# Patient Record
Sex: Female | Born: 1970 | Race: White | Hispanic: No | Marital: Married | Smoking: Former smoker
Health system: Southern US, Community
[De-identification: ages and names within clinical notes are randomized; demographics above are authoritative.]

## PROBLEM LIST (undated history)

## (undated) DIAGNOSIS — E7800 Pure hypercholesterolemia, unspecified: Secondary | ICD-10-CM

## (undated) DIAGNOSIS — I10 Essential (primary) hypertension: Secondary | ICD-10-CM

## (undated) DIAGNOSIS — I499 Cardiac arrhythmia, unspecified: Secondary | ICD-10-CM

## (undated) DIAGNOSIS — R011 Cardiac murmur, unspecified: Secondary | ICD-10-CM

## (undated) HISTORY — PX: TUBAL LIGATION: SHX77

## (undated) HISTORY — PX: WISDOM TOOTH EXTRACTION: SHX21

---

## 2020-08-30 ENCOUNTER — Ambulatory Visit: Payer: Self-pay | Admitting: General Surgery

## 2020-08-30 DIAGNOSIS — R928 Other abnormal and inconclusive findings on diagnostic imaging of breast: Secondary | ICD-10-CM

## 2020-08-31 ENCOUNTER — Other Ambulatory Visit: Payer: Self-pay | Admitting: General Surgery

## 2020-08-31 DIAGNOSIS — R928 Other abnormal and inconclusive findings on diagnostic imaging of breast: Secondary | ICD-10-CM

## 2020-10-08 NOTE — Pre-Procedure Instructions (Signed)
Surgical Instructions    Your procedure is scheduled on Thursday, March 24th.  Report to [facility] Main Entrance "A" at 5:30 A.M., then check in with the Admitting office.  Call this number if you have problems the morning of surgery:  [phone]   If you have any questions prior to your surgery date call [phone]: Open Monday-Friday 8am-4pm    Remember:  Do not eat after midnight the night before your surgery  You may drink clear liquids until 4:30 a.m. the morning of your surgery.   Clear liquids allowed are: Water, Non-Citrus Juices (without pulp), Carbonated Beverages, Clear Tea, Black Coffee Only, and Gatorade    There are no medications that you need to take on the morning of surgery.  As of today, STOP taking any Aspirin (unless otherwise instructed by your surgeon) Aleve, Naproxen, Ibuprofen, Motrin, Advil, Goody's, BC's, all herbal medications, fish oil, and all vitamins.                     Do not wear jewelry, make up, or nail polish            Do not wear lotions, powders, perfumes, or deodorant.            Do not shave 48 hours prior to surgery.              Do not bring valuables to the hospital.            [facility] is not responsible for any belongings or valuables.  Do NOT Smoke (Tobacco/Vaping) or drink Alcohol 24 hours prior to your procedure If you use a CPAP at night, you may bring all equipment for your overnight stay.   Contacts, glasses, dentures or bridgework may not be worn into surgery, please bring cases for these belongings   For patients admitted to the hospital, discharge time will be determined by your treatment team.   Patients discharged the day of surgery will not be allowed to drive home, and someone needs to stay with them for 24 hours.    Special instructions:   [place]- Preparing For Surgery  Before surgery, you can play an important role. Because skin is not sterile, your skin needs to be as free of germs as possible. You  can reduce the number of germs on your skin by washing with CHG (chlorahexidine gluconate) Soap before surgery.  CHG is an antiseptic cleaner which kills germs and bonds with the skin to continue killing germs even after washing.    Oral Hygiene is also important to reduce your risk of infection.  Remember - BRUSH YOUR TEETH THE MORNING OF SURGERY WITH YOUR REGULAR TOOTHPASTE  Please do not use if you have an allergy to CHG or antibacterial soaps. If your skin becomes reddened/irritated stop using the CHG.  Do not shave (including legs and underarms) for at least 48 hours prior to first CHG shower. It is OK to shave your face.  Please follow these instructions carefully.   1. Shower the NIGHT BEFORE SURGERY and the MORNING OF SURGERY  2. If you chose to wash your hair, wash your hair first as usual with your normal shampoo.  3. After you shampoo, rinse your hair and body thoroughly to remove the shampoo.  4. Use CHG Soap as you would any other liquid soap. You can apply CHG directly to the skin and wash gently with a scrungie or a clean washcloth.   5. Apply the CHG  Soap to your body ONLY FROM THE NECK DOWN.  Do not use on open wounds or open sores. Avoid contact with your eyes, ears, mouth and genitals (private parts). Wash Face and genitals (private parts)  with your normal soap.   6. Wash thoroughly, paying special attention to the area where your surgery will be performed.  7. Thoroughly rinse your body with warm water from the neck down.  8. DO NOT shower/wash with your normal soap after using and rinsing off the CHG Soap.  9. Pat yourself dry with a CLEAN TOWEL.  10. Wear CLEAN PAJAMAS to bed the night before surgery  11. Place CLEAN SHEETS on your bed the night before your surgery  12. DO NOT SLEEP WITH PETS.   Day of Surgery: Shower with CHG soap Wear Clean/Comfortable clothing the morning of surgery Do not apply any deodorants/lotions.   Remember to brush your teeth  WITH YOUR REGULAR TOOTHPASTE.   Please read over the following fact sheets that you were given.

## 2020-10-11 ENCOUNTER — Encounter (HOSPITAL_COMMUNITY)
Admission: RE | Admit: 2020-10-11 | Discharge: 2020-10-11 | Disposition: A | Discharge status: Home / Self Care | Payer: BC Managed Care – PPO | Source: Ambulatory Visit | Attending: General Surgery | Admitting: General Surgery

## 2020-10-11 ENCOUNTER — Encounter (HOSPITAL_COMMUNITY): Payer: Self-pay

## 2020-10-11 ENCOUNTER — Other Ambulatory Visit: Payer: Self-pay

## 2020-10-11 ENCOUNTER — Other Ambulatory Visit (HOSPITAL_COMMUNITY)
Admission: RE | Admit: 2020-10-11 | Discharge: 2020-10-11 | Disposition: A | Discharge status: Home / Self Care | Payer: BC Managed Care – PPO | Source: Ambulatory Visit | Attending: General Surgery | Admitting: General Surgery

## 2020-10-11 DIAGNOSIS — I10 Essential (primary) hypertension: Secondary | ICD-10-CM | POA: Insufficient documentation

## 2020-10-11 DIAGNOSIS — N6489 Other specified disorders of breast: Secondary | ICD-10-CM | POA: Diagnosis not present

## 2020-10-11 DIAGNOSIS — Z01812 Encounter for preprocedural laboratory examination: Secondary | ICD-10-CM | POA: Insufficient documentation

## 2020-10-11 DIAGNOSIS — R928 Other abnormal and inconclusive findings on diagnostic imaging of breast: Secondary | ICD-10-CM | POA: Insufficient documentation

## 2020-10-11 DIAGNOSIS — E7800 Pure hypercholesterolemia, unspecified: Secondary | ICD-10-CM | POA: Insufficient documentation

## 2020-10-11 DIAGNOSIS — Z87891 Personal history of nicotine dependence: Secondary | ICD-10-CM | POA: Diagnosis not present

## 2020-10-11 DIAGNOSIS — Z803 Family history of malignant neoplasm of breast: Secondary | ICD-10-CM | POA: Diagnosis not present

## 2020-10-11 DIAGNOSIS — Z20822 Contact with and (suspected) exposure to covid-19: Secondary | ICD-10-CM | POA: Insufficient documentation

## 2020-10-11 DIAGNOSIS — Z79899 Other long term (current) drug therapy: Secondary | ICD-10-CM | POA: Insufficient documentation

## 2020-10-11 DIAGNOSIS — Z01818 Encounter for other preprocedural examination: Secondary | ICD-10-CM | POA: Insufficient documentation

## 2020-10-11 DIAGNOSIS — D242 Benign neoplasm of left breast: Secondary | ICD-10-CM | POA: Diagnosis not present

## 2020-10-11 HISTORY — DX: Cardiac arrhythmia, unspecified: I49.9

## 2020-10-11 HISTORY — DX: Pure hypercholesterolemia, unspecified: E78.00

## 2020-10-11 HISTORY — DX: Cardiac murmur, unspecified: R01.1

## 2020-10-11 HISTORY — DX: Essential (primary) hypertension: I10

## 2020-10-11 LAB — BASIC METABOLIC PANEL
Anion gap: 4 — ABNORMAL LOW (ref 5–15)
BUN: 11 mg/dL (ref 6–20)
CO2: 28 mmol/L (ref 22–32)
Calcium: 9.3 mg/dL (ref 8.9–10.3)
Chloride: 103 mmol/L (ref 98–111)
Creatinine, Ser: 0.85 mg/dL (ref 0.44–1.00)
GFR, Estimated: >60 mL/min (ref >60)
Glucose, Bld: 101 mg/dL — ABNORMAL HIGH (ref 70–99)
Potassium: 4 mmol/L (ref 3.5–5.1)
Sodium: 135 mmol/L (ref 135–145)

## 2020-10-11 LAB — CBC
HCT: 37.2 % (ref 36.0–46.0)
Hemoglobin: 12.9 g/dL (ref 12.0–15.0)
MCH: 31.5 pg (ref 26.0–34.0)
MCHC: 34.7 g/dL (ref 30.0–36.0)
MCV: 91 fL (ref 80.0–100.0)
Platelets: 215 10*3/uL (ref 150–400)
RBC: 4.09 MIL/uL (ref 3.87–5.11)
RDW: 11.9 % (ref 11.5–15.5)
WBC: 5.3 10*3/uL (ref 4.0–10.5)
nRBC: 0 % (ref 0.0–0.2)

## 2020-10-11 LAB — POCT PREGNANCY, URINE: Preg Test, Ur: NEGATIVE

## 2020-10-11 LAB — SARS CORONAVIRUS 2 (TAT 6-24 HRS): SARS Coronavirus 2: NEGATIVE

## 2020-10-11 NOTE — Progress Notes (Signed)
PCP - Fransisco Hertz, NP Cardiologist - denies  Chest x-ray - n/a EKG - 10/11/20 Stress Test - denies ECHO - 10+ years ago? Pt unsure, possibly when pt was giving birth to son in 2004.  Cardiac Cath - denies  Sleep Study - denies CPAP - denies  Blood Thinner Instructions:n/a Aspirin Instructions: n/a  ERAS Protcol - Yes, pt to stop clear liquids by 0430.  PRE-SURGERY Ensure or G2- n/a, no drink ordered.   COVID TEST- 10/11/20  Anesthesia review: No  Patient denies shortness of breath, fever, cough and chest pain at PAT appointment   All instructions explained to the patient, with a verbal understanding of the material. Patient agrees to go over the instructions while at home for a better understanding. Patient also instructed to self quarantine after being tested for COVID-19. The opportunity to ask questions was provided.

## 2020-10-12 NOTE — Anesthesia Preprocedure Evaluation (Addendum)
Anesthesia Evaluation  Patient identified by MRN, date of birth, ID band Patient awake    Reviewed: Allergy & Precautions, NPO status , Patient's Chart, lab work & pertinent test results  Airway Mallampati: II  TM Distance: >3 FB Neck ROM: Full    Dental no notable dental hx.    Pulmonary neg pulmonary ROS, former smoker,    Pulmonary exam normal breath sounds clear to auscultation       Cardiovascular hypertension, negative cardio ROS Normal cardiovascular exam Rhythm:Regular Rate:Normal     Neuro/Psych negative neurological ROS  negative psych ROS   GI/Hepatic negative GI ROS, Neg liver ROS,   Endo/Other  negative endocrine ROS  Renal/GU negative Renal ROS  negative genitourinary   Musculoskeletal negative musculoskeletal ROS (+)   Abdominal   Peds negative pediatric ROS (+)  Hematology negative hematology ROS (+)   Anesthesia Other Findings   Reproductive/Obstetrics negative OB ROS                           Anesthesia Physical Anesthesia Plan  ASA: II  Anesthesia Plan: General   Post-op Pain Management:    Induction: Intravenous  PONV Risk Score and Plan: 3 and Midazolam, Scopolamine patch - Pre-op, Treatment may vary due to age or medical condition, Ondansetron and Dexamethasone  Airway Management Planned: LMA  Additional Equipment: None  Intra-op Plan:   Post-operative Plan: Extubation in OR  Informed Consent: I have reviewed the patients History and Physical, chart, labs and discussed the procedure including the risks, benefits and alternatives for the proposed anesthesia with the patient or authorized representative who has indicated his/her understanding and acceptance.     Dental advisory given  Plan Discussed with: CRNA, Anesthesiologist and Surgeon  Anesthesia Plan Comments: (PAT note written 10/12/2020 by Myra Gianotti, PA-C. )      Anesthesia Quick  Evaluation

## 2020-10-12 NOTE — Progress Notes (Signed)
Anesthesia Chart Review:  Case: [id number] Date/Time: 10/14/20 0715   Procedure: RADIOACTIVE SEED GUIDED EXCISIONAL LEFT BREAST BIOPSY (Left Breast) - RNFA; START TIME OF 7:30 AM FOR 60 MINUTES IN ROOM 2   Anesthesia type: General   Pre-op diagnosis: ABNORMAL LEFT MAMMOGRAM   Location: [facility] OR ROOM 02 / [place] OR   Surgeons: Stark Klein, MD      DISCUSSION: Patient is a 50 year old female scheduled for the above procedure.  History includes former smoker (quit 07/24/12), HTN, childhood murmur (no murmur documented at 01/16/18 GYN visit), hypercholesterolemia, dysrhythmia during pregnancy (> 20 years ago, reported no further work-up recommended following Holter monitor).   Preoperative labs and EKG reviewed. Denied chest pain and SOB at PAT RN visit.   10/11/20 presurgical COVID-19 test negative. She is getting RSL on 10/13/20. Anesthesia team to evaluate on the day of surgery.    VS: BP 112/68   Pulse 73   Temp 36.8 C (Oral)   Resp 18   Ht 5\' 2"  (1.575 m)   Wt 59.6 kg   LMP 10/02/2020 (Exact Date)   SpO2 100%   Breastfeeding Unknown   BMI 24.02 kg/m    PROVIDERS: Rosalee Kaufman, PA-C is PCP ([facility])   LABS: Labs reviewed: Acceptable for surgery. Negative urine pregnancy test.  (all labs ordered are listed, but only abnormal results are displayed)  Labs Reviewed  BASIC METABOLIC PANEL - Abnormal; Notable for the following components:      Result Value   Glucose, Bld 101 (*)    Anion gap 4 (*)    All other components within normal limits  CBC  POCT PREGNANCY, URINE    EKG: Normal sinus rhythm Low voltage QRS Septal infarct , age undetermined Abnormal ECG No old tracing to compare Confirmed by [place] [phone]) on 10/11/2020 12:06:17 PM   CV: She thinks she may have had a peripartum echo in 2004.    Past Medical History:  Diagnosis Date  . Dysrhythmia    During pregnancy; wore Holter monitor for month. No further work-up needed. 21 years  ago.   Marland Kitchen Heart murmur    [place]  . High cholesterol   . Hypertension     Past Surgical History:  Procedure Laterality Date  . TUBAL LIGATION    . WISDOM TOOTH EXTRACTION      MEDICATIONS: . atorvastatin (LIPITOR) 10 MG tablet  . lisinopril (ZESTRIL) 5 MG tablet   No current facility-administered medications for this encounter.    Myra Gianotti, PA-C Surgical Short Stay/Anesthesiology [facility] Phone [phone] [facility] Phone [phone] 10/12/2020 11:33 AM

## 2020-10-13 ENCOUNTER — Ambulatory Visit
Admission: RE | Admit: 2020-10-13 | Discharge: 2020-10-13 | Disposition: A | Discharge status: Home / Self Care | Payer: BC Managed Care – PPO | Source: Ambulatory Visit | Attending: General Surgery | Admitting: General Surgery

## 2020-10-13 ENCOUNTER — Other Ambulatory Visit: Payer: Self-pay | Admitting: General Surgery

## 2020-10-13 DIAGNOSIS — R928 Other abnormal and inconclusive findings on diagnostic imaging of breast: Secondary | ICD-10-CM

## 2020-10-13 NOTE — H&P (Signed)
Nicole Potts Location: [facility] Patient #: [id number] DOB: [birth year] Married / Language: English / Race: White Female   History of Present Illness  The patient is a 50 year old female who presents with a breast mass. Pt is a lovely 50 yo F referred by Dr. Cathie Olden for consultation regarding an abnormal mammogram. She had a screening detected left breast mass. Diagnostic imaging confirmed a 1.7 mm mass at 6 o'clock. 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 She presents for discussion. She does have some extended relatives with breast cancer.   Mammograms were done at [facility]. Images and reports are reviewed and results described above. They are in [place].   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    Past Surgical History Breast Biopsy  Left. Oral Surgery   Diagnostic Studies History Colonoscopy  never Mammogram  within last year Pap Smear  1-5 years ago  Allergies  No Known Drug Allergies  Allergies Reconciled   Medication History  Atorvastatin Calcium (10MG  Tablet, Oral) Active. Lisinopril (5MG  Tablet, Oral) Active.  Social History  Alcohol use  Occasional alcohol use. Caffeine use  Carbonated beverages, Coffee. No drug use  Tobacco use  Former smoker.  Family History  Alcohol Abuse  Family Members In General. Breast Cancer  Family Members In General. Cerebrovascular Accident  Family Members In General. Depression  Father. Diabetes Mellitus  Family Members In General, Father. Hypertension  Mother.  Pregnancy / Birth History Age at menarche  89 years. Contraceptive History  Oral contraceptives. Gravida  3 Length (months) of breastfeeding  7-12 Maternal age  47-30 Para  3 Regular periods   Other Problems Heart murmur  High blood pressure  Hypercholesterolemia  Lump In Breast     Review of Systems  General Not Present- Appetite  Loss, Chills, Fatigue, Fever, Night Sweats, Weight Gain and Weight Loss. Skin Present- Dryness. Not Present- Change in Wart/Mole, Hives, Jaundice, New Lesions, Non-Healing Wounds, Rash and Ulcer. HEENT Present- Wears glasses/contact lenses. Not Present- Earache, Hearing Loss, Hoarseness, Nose Bleed, Oral Ulcers, Ringing in the Ears, Seasonal Allergies, Sinus Pain, Sore Throat, Visual Disturbances and Yellow Eyes. Respiratory Not Present- Bloody sputum, Chronic Cough, Difficulty Breathing, Snoring and Wheezing. Breast Present- Breast Mass. Not Present- Breast Pain, Nipple Discharge and Skin Changes. Cardiovascular Not Present- Chest Pain, Difficulty Breathing Lying Down, Leg Cramps, Palpitations, Rapid Heart Rate, Shortness of Breath and Swelling of Extremities. Gastrointestinal Not Present- Abdominal Pain, Bloating, Bloody Stool, Change in Bowel Habits, Chronic diarrhea, Constipation, Difficulty Swallowing, Excessive gas, Gets full quickly at meals, Hemorrhoids, Indigestion, Nausea, Rectal Pain and Vomiting. Female Genitourinary Not Present- Frequency, Nocturia, Painful Urination, Pelvic Pain and Urgency. Musculoskeletal Not Present- Back Pain, Joint Pain, Joint Stiffness, Muscle Pain, Muscle Weakness and Swelling of Extremities. Neurological Not Present- Decreased Memory, Fainting, Headaches, Numbness, Seizures, Tingling, Tremor, Trouble walking and Weakness. Psychiatric Not Present- Anxiety, Bipolar, Change in Sleep Pattern, Depression, Fearful and Frequent crying. Endocrine Not Present- Cold Intolerance, Excessive Hunger, Hair Changes, Heat Intolerance, Hot flashes and New Diabetes. Hematology Not Present- Blood Thinners, Easy Bruising, Excessive bleeding, Gland problems, HIV and Persistent Infections.  Vitals  Weight: 134.5 lb Height: 62in Body Surface Area: 1.61 m Body Mass Index: 24.6 kg/m  Temp.: 69F  Pulse: 88 (Regular)  P.OX: 97% (Room air) BP: 118/64(Sitting, Left Arm,  Standard)    Physical Exam  General Mental Status-Alert. General Appearance-Consistent with stated age. Hydration-Well hydrated. Voice-Normal.  Head  and Neck Head-normocephalic, atraumatic with no lesions or palpable masses. Trachea-midline. Thyroid Gland Characteristics - normal size and consistency.  Eye Eyeball - Bilateral-Extraocular movements intact. Sclera/Conjunctiva - Bilateral-No scleral icterus.  Chest and Lung Exam Chest and lung exam reveals -quiet, even and easy respiratory effort with no use of accessory muscles and on auscultation, normal breath sounds, no adventitious sounds and normal vocal resonance. Inspection Chest Wall - Normal. Back - normal.  Breast Note: breasts are symmetric bilaterally. no significant ptosis. no palpable masses. no skin dimpling or nipple retraction. no nipple discharge. no LAD.   Cardiovascular Cardiovascular examination reveals -normal heart sounds, regular rate and rhythm with no murmurs and normal pedal pulses bilaterally.  Abdomen Inspection Inspection of the abdomen reveals - No Hernias. Palpation/Percussion Palpation and Percussion of the abdomen reveal - Soft, Non Tender, No Rebound tenderness, No Rigidity (guarding) and No hepatosplenomegaly. Auscultation Auscultation of the abdomen reveals - Bowel sounds normal.  Neurologic Neurologic evaluation reveals -alert and oriented x 3 with no impairment of recent or remote memory. Mental Status-Normal.  Musculoskeletal Global Assessment -Note: no gross deformities.  Normal Exam - Left-Upper Extremity Strength Normal and Lower Extremity Strength Normal. Normal Exam - Right-Upper Extremity Strength Normal and Lower Extremity Strength Normal.  Lymphatic Head & Neck  General Head & Neck Lymphatics: Bilateral - Description - Normal. Axillary  General Axillary Region: Bilateral - Description - Normal. Tenderness - Non Tender. Femoral &  Inguinal  Generalized Femoral & Inguinal Lymphatics: Bilateral - Description - No Generalized lymphadenopathy.    Assessment & Plan ABNORMAL MAMMOGRAM OF LEFT BREAST (R92.8) Impression: Pt has a mass on the left with a discordant core needle biopsy. Will plan seed localized excision. Will try to target a long weekend for her in order to minimize time off.  The surgical procedure was described to the patient. I discussed the incision type and location and that we would need radiology involved on with a wire or seed marker and/or sentinel node.  The risks and benefits of the procedure were described to the patient and she wishes to proceed.  We discussed the risks bleeding, infection, damage to other structures, need for further procedures/surgeries. We discussed the risk of seroma. The patient was advised if the area in the breast in cancer, we may need to go back to surgery for additional tissue to obtain negative margins or for a lymph node biopsy. The patient was advised that these are the most common complications, but that others can occur as well. They were advised against taking aspirin or other anti-inflammatory agents/blood thinners the week before surgery. Current Plans You are being scheduled for surgery- Our schedulers will call you.  You should hear from our office's scheduling department within 5 working days about the location, date, and time of surgery. We try to make accommodations for patient's preferences in scheduling surgery, but sometimes the OR schedule or the surgeon's schedule prevents [place] from making those accommodations.  If you have not heard from our office [phone]) in 5 working days, call the office and ask for your surgeon's nurse.  If you have other questions about your diagnosis, plan, or surgery, call the office and ask for your surgeon's nurse.  Pt Education - [facility] Breast Biopsy HCI: discussed with patient and provided information.

## 2020-10-14 ENCOUNTER — Encounter (HOSPITAL_COMMUNITY)
Admission: RE | Disposition: A | Discharge status: Home / Self Care | Payer: Self-pay | Source: Ambulatory Visit | Attending: General Surgery

## 2020-10-14 ENCOUNTER — Ambulatory Visit
Admission: RE | Admit: 2020-10-14 | Discharge: 2020-10-14 | Disposition: A | Discharge status: Home / Self Care | Payer: BC Managed Care – PPO | Source: Ambulatory Visit | Attending: General Surgery | Admitting: General Surgery

## 2020-10-14 ENCOUNTER — Ambulatory Visit (HOSPITAL_COMMUNITY)
Admission: RE | Admit: 2020-10-14 | Discharge: 2020-10-14 | Disposition: A | Discharge status: Home / Self Care | Payer: BC Managed Care – PPO | Source: Ambulatory Visit | Attending: General Surgery | Admitting: General Surgery

## 2020-10-14 ENCOUNTER — Ambulatory Visit (HOSPITAL_COMMUNITY): Payer: BC Managed Care – PPO | Admitting: Anesthesiology

## 2020-10-14 ENCOUNTER — Ambulatory Visit (HOSPITAL_COMMUNITY): Payer: BC Managed Care – PPO | Admitting: Vascular Surgery

## 2020-10-14 ENCOUNTER — Other Ambulatory Visit: Payer: Self-pay

## 2020-10-14 ENCOUNTER — Encounter (HOSPITAL_COMMUNITY): Payer: Self-pay | Admitting: General Surgery

## 2020-10-14 DIAGNOSIS — Z87891 Personal history of nicotine dependence: Secondary | ICD-10-CM | POA: Insufficient documentation

## 2020-10-14 DIAGNOSIS — Z803 Family history of malignant neoplasm of breast: Secondary | ICD-10-CM | POA: Insufficient documentation

## 2020-10-14 DIAGNOSIS — N6489 Other specified disorders of breast: Secondary | ICD-10-CM | POA: Insufficient documentation

## 2020-10-14 DIAGNOSIS — Z20822 Contact with and (suspected) exposure to covid-19: Secondary | ICD-10-CM | POA: Insufficient documentation

## 2020-10-14 DIAGNOSIS — R928 Other abnormal and inconclusive findings on diagnostic imaging of breast: Secondary | ICD-10-CM

## 2020-10-14 DIAGNOSIS — Z79899 Other long term (current) drug therapy: Secondary | ICD-10-CM | POA: Insufficient documentation

## 2020-10-14 DIAGNOSIS — D242 Benign neoplasm of left breast: Secondary | ICD-10-CM | POA: Insufficient documentation

## 2020-10-14 HISTORY — PX: RADIOACTIVE SEED GUIDED EXCISIONAL BREAST BIOPSY: SHX6490

## 2020-10-14 SURGERY — RADIOACTIVE SEED GUIDED BREAST BIOPSY
Anesthesia: General | Site: Breast | Laterality: Left

## 2020-10-14 MED ORDER — ACETAMINOPHEN 500 MG PO TABS: 1000.0000 mg | ORAL_TABLET | ORAL | Status: AC

## 2020-10-14 MED ORDER — LIDOCAINE 2% (20 MG/ML) 5 ML SYRINGE
INTRAMUSCULAR | Status: AC
  Filled 2020-10-14: qty 5

## 2020-10-14 MED ORDER — LIDOCAINE-EPINEPHRINE 1 %-1:100000 IJ SOLN
INTRAMUSCULAR | Status: DC | PRN
  Administered 2020-10-14: 15 mL

## 2020-10-14 MED ORDER — BUPIVACAINE HCL (PF) 0.25 % IJ SOLN
INTRAMUSCULAR | Status: AC
  Filled 2020-10-14: qty 30

## 2020-10-14 MED ORDER — AMISULPRIDE (ANTIEMETIC) 5 MG/2ML IV SOLN: 10.0000 mg | Freq: Once | INTRAVENOUS | Status: DC | PRN

## 2020-10-14 MED ORDER — PROPOFOL 10 MG/ML IV BOLUS
INTRAVENOUS | Status: DC | PRN
  Administered 2020-10-14: 120 mg via INTRAVENOUS

## 2020-10-14 MED ORDER — CEFAZOLIN SODIUM-DEXTROSE 2-4 GM/100ML-% IV SOLN: 2.0000 g | INTRAVENOUS | Status: DC

## 2020-10-14 MED ORDER — ORAL CARE MOUTH RINSE: 15.0000 mL | Freq: Once | OROMUCOSAL | Status: AC

## 2020-10-14 MED ORDER — EPHEDRINE 5 MG/ML INJ
INTRAVENOUS | Status: AC
  Filled 2020-10-14: qty 10

## 2020-10-14 MED ORDER — OXYCODONE HCL 5 MG PO TABS
5.0000 mg | ORAL_TABLET | Freq: Once | ORAL | Status: AC | PRN
  Administered 2020-10-14: 5 mg via ORAL

## 2020-10-14 MED ORDER — MIDAZOLAM HCL 5 MG/5ML IJ SOLN
INTRAMUSCULAR | Status: DC | PRN
  Administered 2020-10-14: 2 mg via INTRAVENOUS

## 2020-10-14 MED ORDER — PROPOFOL 10 MG/ML IV BOLUS
INTRAVENOUS | Status: AC
  Filled 2020-10-14: qty 20

## 2020-10-14 MED ORDER — ONDANSETRON HCL 4 MG/2ML IJ SOLN
INTRAMUSCULAR | Status: AC
  Filled 2020-10-14: qty 2

## 2020-10-14 MED ORDER — FENTANYL CITRATE (PF) 100 MCG/2ML IJ SOLN
25.0000 ug | INTRAMUSCULAR | Status: DC | PRN
  Administered 2020-10-14 (×2): 25 ug via INTRAVENOUS

## 2020-10-14 MED ORDER — CHLORHEXIDINE GLUCONATE CLOTH 2 % EX PADS: 6.0000 | MEDICATED_PAD | Freq: Once | CUTANEOUS | Status: DC

## 2020-10-14 MED ORDER — 0.9 % SODIUM CHLORIDE (POUR BTL) OPTIME
TOPICAL | Status: DC | PRN
  Administered 2020-10-14: 1000 mL

## 2020-10-14 MED ORDER — LIDOCAINE 2% (20 MG/ML) 5 ML SYRINGE
INTRAMUSCULAR | Status: DC | PRN
  Administered 2020-10-14: 80 mg via INTRAVENOUS

## 2020-10-14 MED ORDER — PHENYLEPHRINE 40 MCG/ML (10ML) SYRINGE FOR IV PUSH (FOR BLOOD PRESSURE SUPPORT)
PREFILLED_SYRINGE | INTRAVENOUS | Status: AC
  Filled 2020-10-14: qty 10

## 2020-10-14 MED ORDER — SCOPOLAMINE 1 MG/3DAYS TD PT72
1.0000 | MEDICATED_PATCH | TRANSDERMAL | Status: DC
  Administered 2020-10-14: 1.5 mg via TRANSDERMAL
  Filled 2020-10-14: qty 1

## 2020-10-14 MED ORDER — OXYCODONE HCL 5 MG/5ML PO SOLN
5.0000 mg | Freq: Once | ORAL | Status: AC | PRN
Start: 2020-10-14 — End: 2020-10-14

## 2020-10-14 MED ORDER — CHLORHEXIDINE GLUCONATE 0.12 % MT SOLN: 15.0000 mL | Freq: Once | OROMUCOSAL | Status: AC

## 2020-10-14 MED ORDER — OXYCODONE HCL 5 MG PO TABS
ORAL_TABLET | ORAL | Status: AC
  Filled 2020-10-14: qty 1

## 2020-10-14 MED ORDER — PROMETHAZINE HCL 25 MG/ML IJ SOLN: 6.2500 mg | INTRAMUSCULAR | Status: DC | PRN

## 2020-10-14 MED ORDER — ACETAMINOPHEN 500 MG PO TABS
ORAL_TABLET | ORAL | Status: AC
  Administered 2020-10-14: 1000 mg via ORAL
  Filled 2020-10-14: qty 2

## 2020-10-14 MED ORDER — DEXAMETHASONE SODIUM PHOSPHATE 10 MG/ML IJ SOLN
INTRAMUSCULAR | Status: DC | PRN
  Administered 2020-10-14: 5 mg via INTRAVENOUS

## 2020-10-14 MED ORDER — MIDAZOLAM HCL 2 MG/2ML IJ SOLN
INTRAMUSCULAR | Status: AC
  Filled 2020-10-14: qty 2

## 2020-10-14 MED ORDER — FENTANYL CITRATE (PF) 100 MCG/2ML IJ SOLN
INTRAMUSCULAR | Status: AC
  Filled 2020-10-14: qty 2

## 2020-10-14 MED ORDER — CEFAZOLIN SODIUM-DEXTROSE 2-4 GM/100ML-% IV SOLN
INTRAVENOUS | Status: AC
  Filled 2020-10-14: qty 100

## 2020-10-14 MED ORDER — EPHEDRINE SULFATE-NACL 50-0.9 MG/10ML-% IV SOSY
PREFILLED_SYRINGE | INTRAVENOUS | Status: DC | PRN
  Administered 2020-10-14 (×2): 10 mg via INTRAVENOUS

## 2020-10-14 MED ORDER — BUPIVACAINE HCL (PF) 0.25 % IJ SOLN
INTRAMUSCULAR | Status: DC | PRN
  Administered 2020-10-14: 15 mL

## 2020-10-14 MED ORDER — CIPROFLOXACIN IN D5W 400 MG/200ML IV SOLN
INTRAVENOUS | Status: DC | PRN
  Administered 2020-10-14: 400 mg via INTRAVENOUS

## 2020-10-14 MED ORDER — FENTANYL CITRATE (PF) 250 MCG/5ML IJ SOLN
INTRAMUSCULAR | Status: AC
  Filled 2020-10-14: qty 5

## 2020-10-14 MED ORDER — CHLORHEXIDINE GLUCONATE 0.12 % MT SOLN
OROMUCOSAL | Status: AC
  Administered 2020-10-14: 15 mL via OROMUCOSAL
  Filled 2020-10-14: qty 15

## 2020-10-14 MED ORDER — SUCCINYLCHOLINE CHLORIDE 200 MG/10ML IV SOSY
PREFILLED_SYRINGE | INTRAVENOUS | Status: AC
  Filled 2020-10-14: qty 10

## 2020-10-14 MED ORDER — LIDOCAINE-EPINEPHRINE 1 %-1:100000 IJ SOLN
INTRAMUSCULAR | Status: AC
  Filled 2020-10-14: qty 1

## 2020-10-14 MED ORDER — ONDANSETRON HCL 4 MG/2ML IJ SOLN
INTRAMUSCULAR | Status: DC | PRN
  Administered 2020-10-14: 4 mg via INTRAVENOUS

## 2020-10-14 MED ORDER — FENTANYL CITRATE (PF) 100 MCG/2ML IJ SOLN
INTRAMUSCULAR | Status: DC | PRN
  Administered 2020-10-14: 25 ug via INTRAVENOUS
  Administered 2020-10-14: 50 ug via INTRAVENOUS

## 2020-10-14 MED ORDER — LACTATED RINGERS IV SOLN: INTRAVENOUS | Status: DC

## 2020-10-14 MED ORDER — OXYCODONE HCL 5 MG PO TABS: 5.0000 mg | ORAL_TABLET | Freq: Four times a day (QID) | ORAL | 0 refills | Status: AC | PRN

## 2020-10-14 SURGICAL SUPPLY — 45 items
BINDER BREAST LRG (GAUZE/BANDAGES/DRESSINGS) ×2 IMPLANT
BINDER BREAST XLRG (GAUZE/BANDAGES/DRESSINGS) IMPLANT
BNDG COHESIVE 4X5 TAN STRL (GAUZE/BANDAGES/DRESSINGS) ×2 IMPLANT
CANISTER SUCT 3000ML PPV (MISCELLANEOUS) ×2 IMPLANT
CHLORAPREP W/TINT 26 (MISCELLANEOUS) ×2 IMPLANT
CLIP VESOCCLUDE LG 6/CT (CLIP) ×2 IMPLANT
CLIP VESOCCLUDE MED 6/CT (CLIP) ×2 IMPLANT
CLIP VESOCCLUDE SM WIDE 6/CT (CLIP) ×2 IMPLANT
CNTNR URN SCR LID CUP LEK RST (MISCELLANEOUS) IMPLANT
CONT SPEC 4OZ STRL OR WHT (MISCELLANEOUS)
COVER PROBE W GEL 5X96 (DRAPES) ×2 IMPLANT
COVER SURGICAL LIGHT HANDLE (MISCELLANEOUS) ×2 IMPLANT
COVER WAND RF STERILE (DRAPES) IMPLANT
DERMABOND ADVANCED (GAUZE/BANDAGES/DRESSINGS) ×1
DERMABOND ADVANCED .7 DNX12 (GAUZE/BANDAGES/DRESSINGS) ×1 IMPLANT
DEVICE DUBIN SPECIMEN MAMMOGRA (MISCELLANEOUS) IMPLANT
DRAPE CHEST BREAST 15X10 FENES (DRAPES) ×2 IMPLANT
DRAPE SURG 17X23 STRL (DRAPES) IMPLANT
DRSG PAD ABDOMINAL 8X10 ST (GAUZE/BANDAGES/DRESSINGS) ×2 IMPLANT
ELECT COATED BLADE 2.86 ST (ELECTRODE) ×2 IMPLANT
ELECT NEEDLE BLADE 2-5/6 (NEEDLE) ×2 IMPLANT
ELECT REM PT RETURN 9FT ADLT (ELECTROSURGICAL) ×2
ELECTRODE REM PT RTRN 9FT ADLT (ELECTROSURGICAL) ×1 IMPLANT
GAUZE SPONGE 4X4 12PLY STRL (GAUZE/BANDAGES/DRESSINGS) ×2 IMPLANT
GLOVE BIO SURGEON STRL SZ 6 (GLOVE) ×2 IMPLANT
GLOVE SURG UNDER LTX SZ6.5 (GLOVE) ×2 IMPLANT
GOWN STRL REUS W/ TWL LRG LVL3 (GOWN DISPOSABLE) ×1 IMPLANT
GOWN STRL REUS W/TWL 2XL LVL3 (GOWN DISPOSABLE) ×2 IMPLANT
GOWN STRL REUS W/TWL LRG LVL3 (GOWN DISPOSABLE) ×1
KIT BASIN OR (CUSTOM PROCEDURE TRAY) ×2 IMPLANT
KIT MARKER MARGIN INK (KITS) ×2 IMPLANT
LIGHT WAVEGUIDE WIDE FLAT (MISCELLANEOUS) IMPLANT
NEEDLE 18GX1X1/2 (RX/OR ONLY) (NEEDLE) IMPLANT
NEEDLE FILTER BLUNT 18X 1/2SAF (NEEDLE)
NEEDLE FILTER BLUNT 18X1 1/2 (NEEDLE) IMPLANT
NEEDLE HYPO 25GX1X1/2 BEV (NEEDLE) ×2 IMPLANT
NS IRRIG 1000ML POUR BTL (IV SOLUTION) ×2 IMPLANT
PACK GENERAL/GYN (CUSTOM PROCEDURE TRAY) ×2 IMPLANT
PACK UNIVERSAL I (CUSTOM PROCEDURE TRAY) ×2 IMPLANT
STOCKINETTE IMPERVIOUS 9X36 MD (GAUZE/BANDAGES/DRESSINGS) ×2 IMPLANT
SUT MNCRL AB 4-0 PS2 18 (SUTURE) ×2 IMPLANT
SUT VIC AB 3-0 SH 8-18 (SUTURE) ×2 IMPLANT
SYR CONTROL 10ML LL (SYRINGE) ×2 IMPLANT
TOWEL GREEN STERILE (TOWEL DISPOSABLE) ×2 IMPLANT
TOWEL GREEN STERILE FF (TOWEL DISPOSABLE) ×2 IMPLANT

## 2020-10-14 NOTE — Anesthesia Postprocedure Evaluation (Signed)
Anesthesia Post Note  Patient: Nicole Potts  Procedure(s) Performed: RADIOACTIVE SEED GUIDED EXCISIONAL LEFT BREAST BIOPSY (Left Breast)     Patient location during evaluation: PACU Anesthesia Type: General Level of consciousness: awake Pain management: pain level controlled Vital Signs Assessment: post-procedure vital signs reviewed and stable Respiratory status: spontaneous breathing and respiratory function stable Cardiovascular status: stable Postop Assessment: no apparent nausea or vomiting Anesthetic complications: no   No complications documented.  Last Vitals:  Vitals:   10/14/20 0925 10/14/20 0937  BP: 127/60   Pulse: (!) 119   Resp: 13   Temp:  (!) 36.2 C  SpO2: 99%     Last Pain:  Vitals:   10/14/20 0937  TempSrc:   PainSc: 2                  Merlinda Frederick

## 2020-10-14 NOTE — Op Note (Signed)
Left Breast Radioactive seed localized excisional biopsy  Indications: This patient presents with history of abnormal left mammogram with discordant core needle biopsy.    Pre-operative Diagnosis: abnormal left mammogram    Post-operative Diagnosis: abnormal left mammogram  Surgeon: Stark Klein   Anesthesia: General endotracheal anesthesia  ASA Class: 2  Procedure Details  The patient was seen in the Holding Room. The risks, benefits, complications, treatment options, and expected outcomes were discussed with the patient. The possibilities of bleeding, infection, the need for additional procedures, failure to diagnose a condition, and creating a complication requiring transfusion or operation were discussed with the patient. The patient concurred with the proposed plan, giving informed consent.  The site of surgery properly noted/marked. The patient was taken to Operating Room # 2, identified, and the procedure verified as left Breast seed localized excisional biopsy. A Time Out was held and the above information confirmed.  The left breast and chest were prepped and draped in standard fashion. A inferior circumareolar incision was made near the previously placed radioactive seed.  Dissection was carried down around the point of maximum signal intensity. The cautery was used to perform the dissection.   The specimen was inked with the margin marker paint kit.    Specimen radiography confirmed inclusion of the mammographic lesion, the clip, and the seed.  The background signal in the breast was zero.  Two clips were placed in the breast cavity.  Hemostasis was achieved with cautery.  The wound was irrigated and closed with 3-0 vicryl interrupted deep dermal sutures and 4-0 monocryl running subcuticular suture.      Sterile dressings were applied. At the end of the operation, all sponge, instrument, and needle counts were correct.  Findings: Seed, clip in specimen. Posterior margin is  pectoralis  Estimated Blood Loss:  min         Specimens: left breast tissue with seed         Complications:  None; patient tolerated the procedure well.         Disposition: PACU - hemodynamically stable.         Condition: stable

## 2020-10-14 NOTE — Interval H&P Note (Signed)
History and Physical Interval Note:  10/14/2020 7:43 AM  Nicole Potts  has presented today for surgery, with the diagnosis of ABNORMAL LEFT MAMMOGRAM.  The various methods of treatment have been discussed with the patient and family. After consideration of risks, benefits and other options for treatment, the patient has consented to  Procedure(s): RADIOACTIVE SEED GUIDED EXCISIONAL LEFT BREAST BIOPSY (Left) as a surgical intervention.  The patient's history has been reviewed, patient examined, no change in status, stable for surgery.  I have reviewed the patient's chart and labs.  Questions were answered to the patient's satisfaction.     Stark Klein

## 2020-10-14 NOTE — Anesthesia Procedure Notes (Signed)
Procedure Name: LMA Insertion Date/Time: 10/14/2020 8:02 AM Performed by: Imagene Riches, CRNA Pre-anesthesia Checklist: Patient identified, Emergency Drugs available, Suction available and Patient being monitored Patient Re-evaluated:Patient Re-evaluated prior to induction Oxygen Delivery Method: Circle System Utilized Preoxygenation: Pre-oxygenation with 100% oxygen Induction Type: IV induction Ventilation: Mask ventilation without difficulty LMA: LMA inserted LMA Size: 4.0 Number of attempts: 1 Airway Equipment and Method: Bite block Placement Confirmation: positive ETCO2 Tube secured with: Tape Dental Injury: Teeth and Oropharynx as per pre-operative assessment

## 2020-10-14 NOTE — Transfer of Care (Signed)
Immediate Anesthesia Transfer of Care Note  Patient: Nicole Potts  Procedure(s) Performed: RADIOACTIVE SEED GUIDED EXCISIONAL LEFT BREAST BIOPSY (Left Breast)  Patient Location: PACU  Anesthesia Type:General  Level of Consciousness: drowsy  Airway & Oxygen Therapy: Patient Spontanous Breathing and Patient connected to nasal cannula oxygen  Post-op Assessment: Report given to RN and Post -op Vital signs reviewed and stable  Post vital signs: Reviewed and stable  Last Vitals:  Vitals Value Taken Time  BP 115/61 10/14/20 0907  Temp    Pulse 122 10/14/20 0908  Resp 18 10/14/20 0908  SpO2 99 % 10/14/20 0908  Vitals shown include unvalidated device data.  Last Pain:  Vitals:   10/14/20 0653  TempSrc:   PainSc: 0-No pain      Patients Stated Pain Goal: 3 (01/14/75 2831)  Complications: No complications documented.

## 2020-10-14 NOTE — Discharge Instructions (Addendum)
[facility] °Office Phone Number [phone] ° °BREAST BIOPSY/ PARTIAL MASTECTOMY: POST OP INSTRUCTIONS ° °Always review your discharge instruction sheet given to you by the facility where your surgery was performed. ° °IF YOU HAVE DISABILITY OR FAMILY LEAVE FORMS, YOU MUST BRING THEM TO THE OFFICE FOR PROCESSING.  DO NOT GIVE THEM TO YOUR DOCTOR. ° °1. A prescription for pain medication may be given to you upon discharge.  Take your pain medication as prescribed, if needed.  If narcotic pain medicine is not needed, then you may take acetaminophen (Tylenol) or ibuprofen (Advil) as needed. °2. Take your usually prescribed medications unless otherwise directed °3. If you need a refill on your pain medication, please contact your pharmacy.  They will contact our office to request authorization.  Prescriptions will not be filled after 5pm or on week-ends. °4. You should eat very light the first 24 hours after surgery, such as soup, crackers, pudding, etc.  Resume your normal diet the day after surgery. °5. Most patients will experience some swelling and bruising in the breast.  Ice packs and a good support bra will help.  Swelling and bruising can take several days to resolve.  °6. It is common to experience some constipation if taking pain medication after surgery.  Increasing fluid intake and taking a stool softener will usually help or prevent this problem from occurring.  A mild laxative (Milk of Magnesia or Miralax) should be taken according to package directions if there are no bowel movements after 48 hours. °7. Unless discharge instructions indicate otherwise, you may remove your bandages 48 hours after surgery, and you may shower at that time.  You may have steri-strips (small skin tapes) in place directly over the incision.  These strips should be left on the skin for 7-10 days.   Any sutures or staples will be removed at the office during your follow-up visit. °8. ACTIVITIES:  You may resume  regular daily activities (gradually increasing) beginning the next day.  Wearing a good support bra or sports bra (or the breast binder) minimizes pain and swelling.  You may have sexual intercourse when it is comfortable. °a. You may drive when you no longer are taking prescription pain medication, you can comfortably wear a seatbelt, and you can safely maneuver your car and apply brakes. °b. RETURN TO WORK:  __________1 week_______________ °9. You should see your doctor in the office for a follow-up appointment approximately two weeks after your surgery.  Your doctor’s nurse will typically make your follow-up appointment when she calls you with your pathology report.  Expect your pathology report 2-3 business days after your surgery.  You may call to check if you do not hear from us after three days. ° ° °WHEN TO CALL YOUR DOCTOR: °1. Fever over 101.0 °2. Nausea and/or vomiting. °3. Extreme swelling or bruising. °4. Continued bleeding from incision. °5. Increased pain, redness, or drainage from the incision. ° °The clinic staff is available to answer your questions during regular business hours.  Please don’t hesitate to call and ask to speak to one of the nurses for clinical concerns.  If you have a medical emergency, go to the nearest emergency room or call 911.  A surgeon from [facility] is always on call at the hospital. ° °For further questions, please visit [email or web address]  ° °

## 2020-10-15 ENCOUNTER — Encounter (HOSPITAL_COMMUNITY): Payer: Self-pay | Admitting: General Surgery

## 2020-10-18 LAB — SURGICAL PATHOLOGY

## 2021-12-07 ENCOUNTER — Encounter (HOSPITAL_COMMUNITY): Payer: Self-pay

## 2022-10-08 IMAGING — US US PLC BREAST LOC DEV 1ST LESION INC US GUIDE*L*
1 series · 2 of 2 positions shown · non-contrast
Comparison: Previous exam(s).

CLINICAL DATA: Patient for preoperative localization prior to left
breast excision.

EXAM:
ULTRASOUND GUIDED RADIOACTIVE SEED LOCALIZATION OF THE LEFT BREAST

[Series 1: us plc breast loc dev 1st lesion inc us guide*left · 0.06mm/px · 2 of 2 slices shown]
[im 1/2]
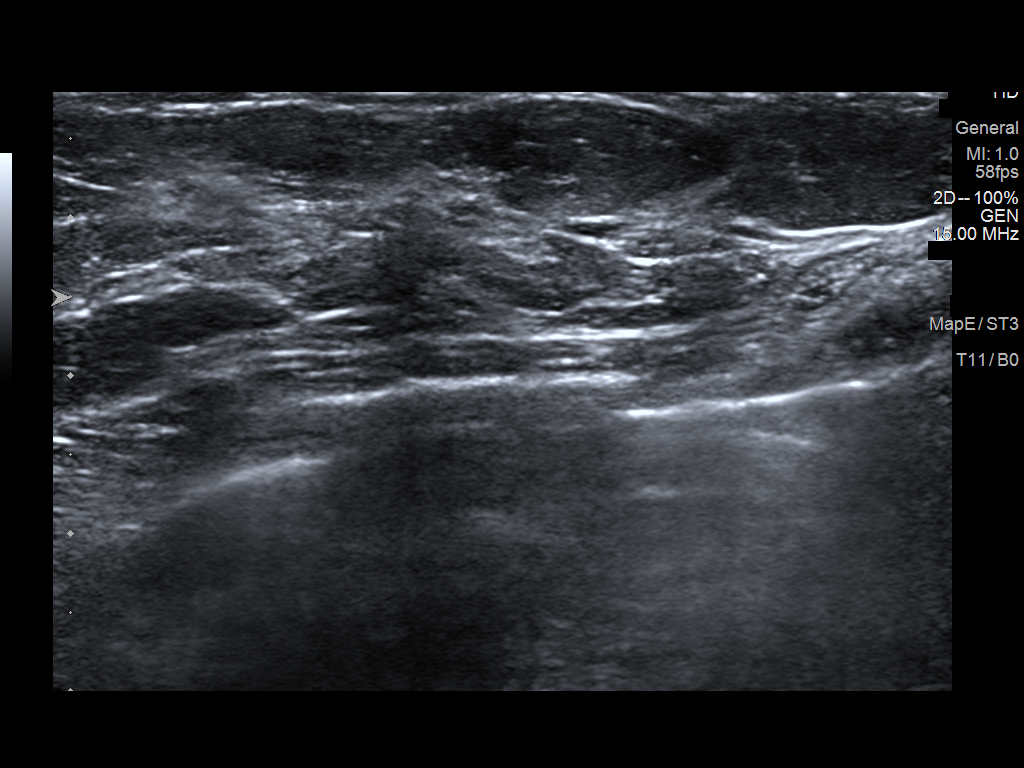
[im 2/2]
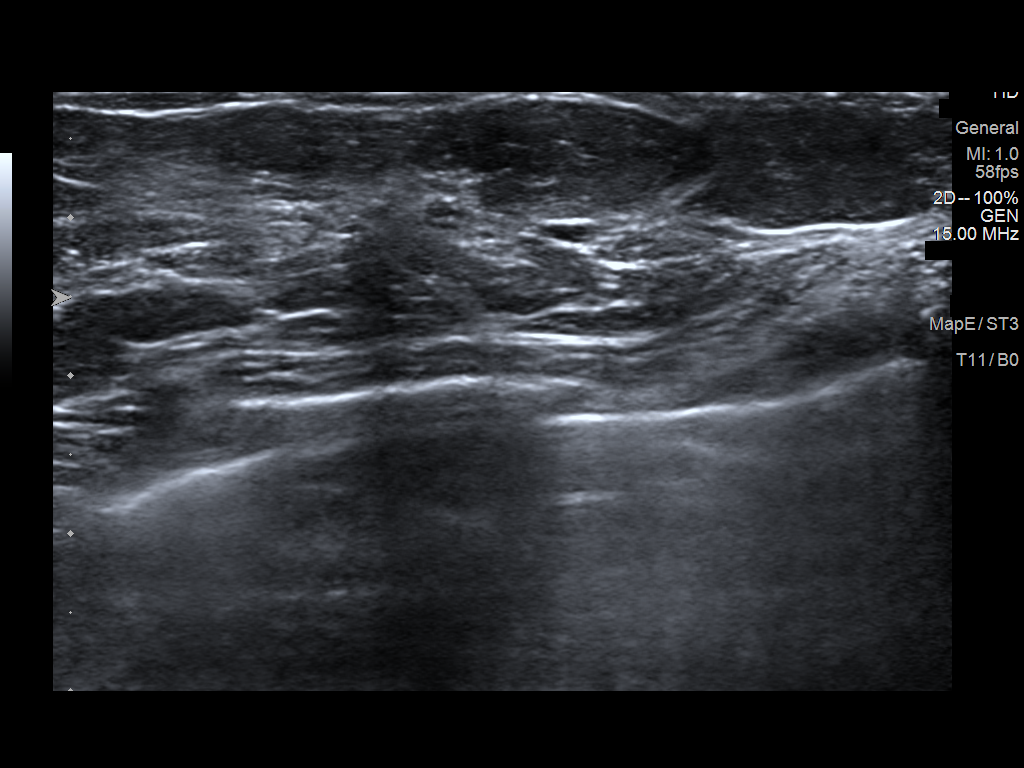

[2 of 2 positions shown; findings below may reference images not displayed]

FINDINGS: Patient presents for radioactive seed localization prior to left
excision. I met with the patient and we discussed the procedure of
seed localization including benefits and alternatives. We discussed
the high likelihood of a successful procedure. We discussed the
risks of the procedure including infection, bleeding, tissue injury
and further surgery. We discussed the low dose of radioactivity
involved in the procedure. Informed, written consent was given.

The usual time-out protocol was performed immediately prior to the
procedure.

Using ultrasound guidance, sterile technique, 1% lidocaine and an
N-MMB radioactive seed, distortion and biopsy marking clip were
localized using a medial approach. The follow-up mammogram images
confirm the seed in the expected location and were marked for Dr.
Roberto.

Follow-up survey of the patient confirms presence of the radioactive
seed.

Order number of N-MMB seed:  [id number].

Total activity:  0.247 millicuries reference Date: 09/23/2020

The patient tolerated the procedure well and was released from the
[REDACTED]. She was given instructions regarding seed removal.
IMPRESSION: Radioactive seed localization left breast. No apparent
complications.

## 2022-10-08 IMAGING — MG MM BREAST LOCALIZATION CLIP
4 series · 4 of 12 positions shown · non-contrast
Comparison: Previous exam(s).

CLINICAL DATA: Patient status post ultrasound-guided seed placement
left breast.

EXAM:
DIAGNOSTIC LEFT MAMMOGRAM POST ULTRASOUND-GUIDED RADIOACTIVE SEED
PLACEMENT

[L ML synth-2D]
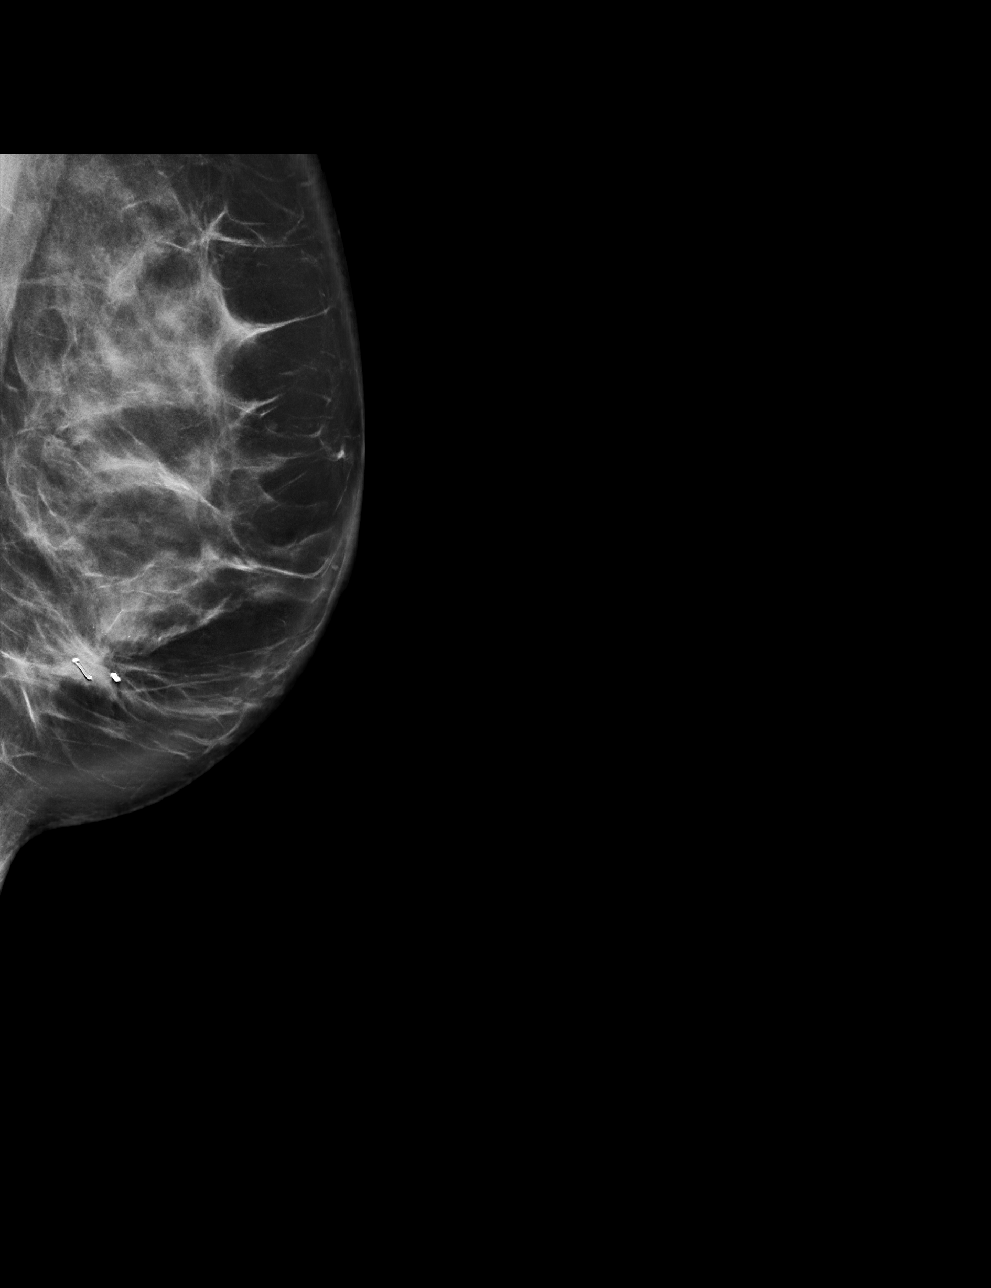

[L CC synth-2D]
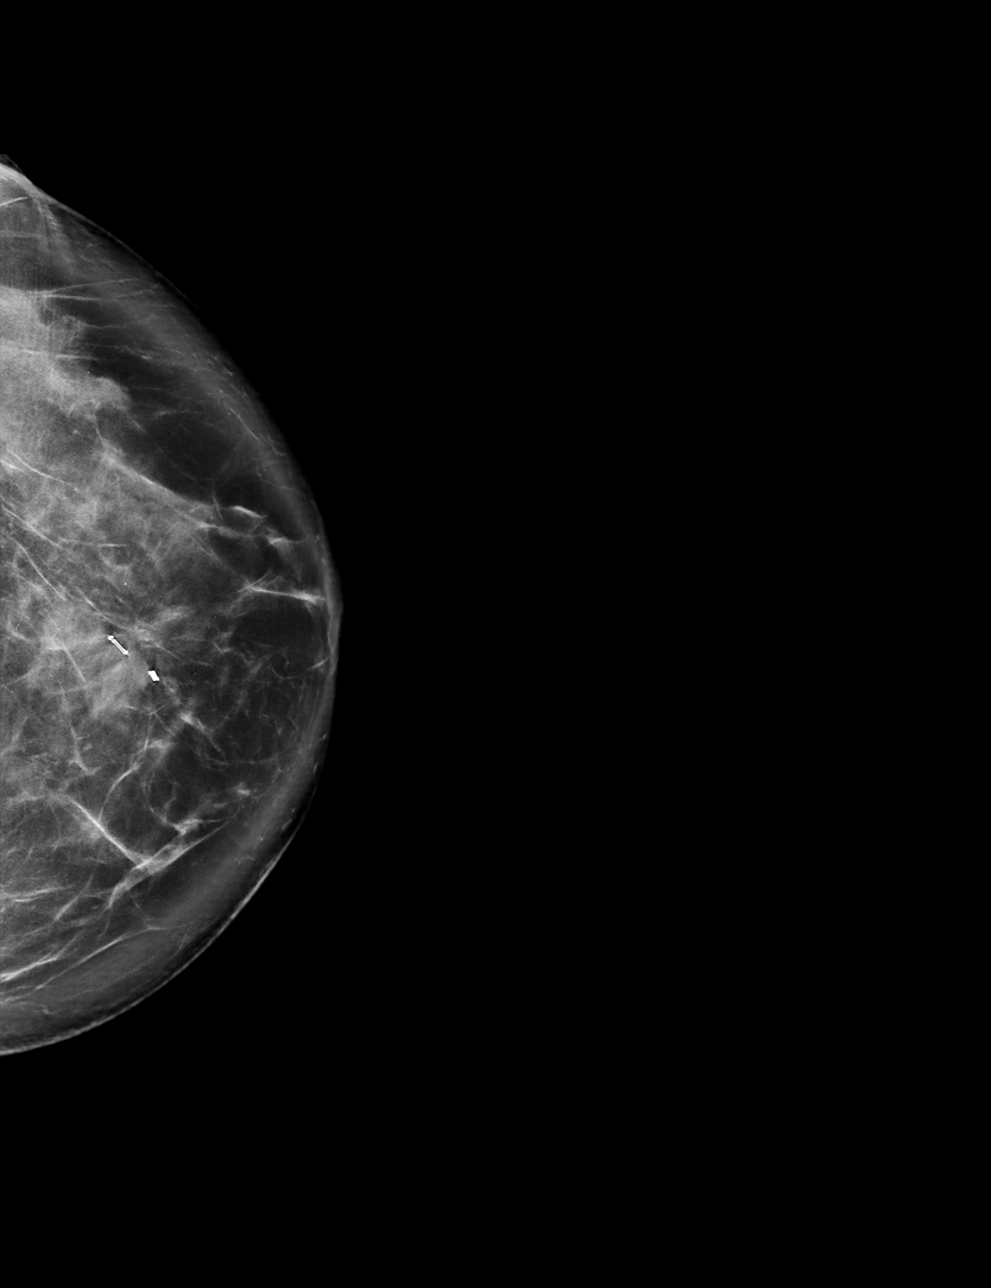

[L CC tomo · tomo slice 51/101.0]
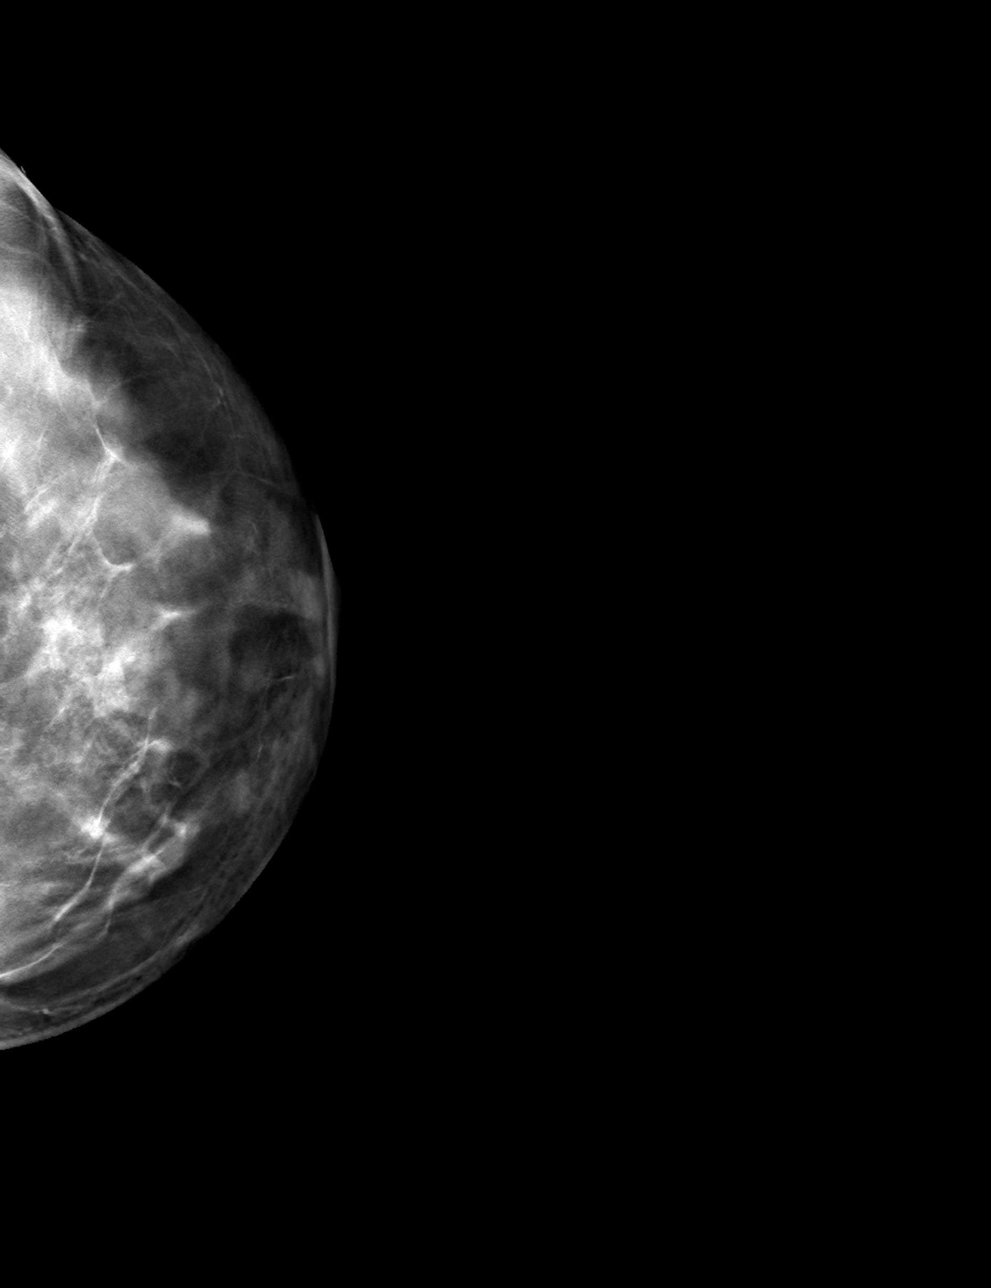

[L ML tomo · tomo slice 51/101.0]
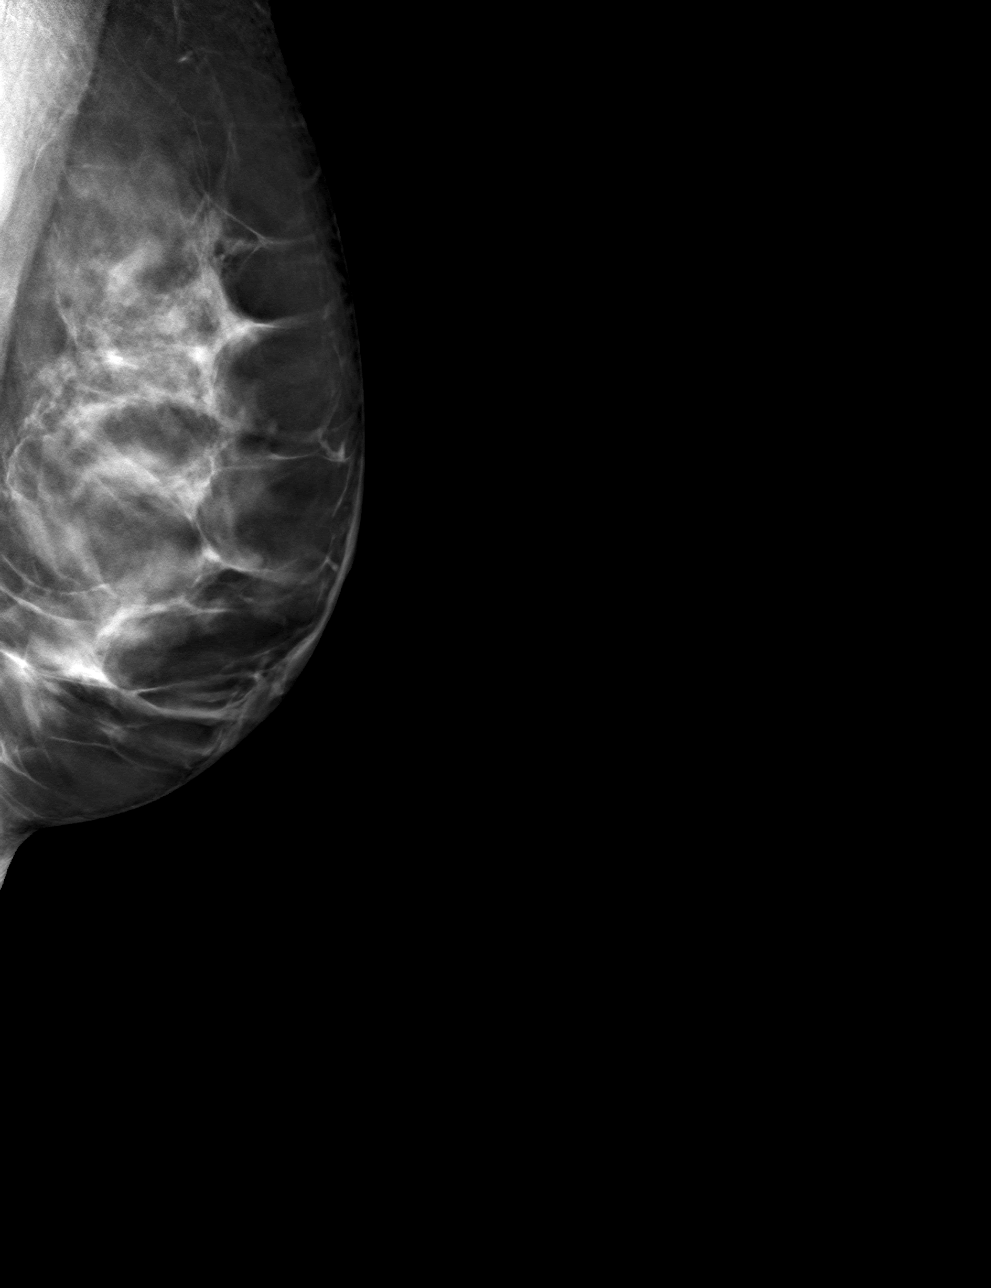

[4 of 12 positions shown; findings below may reference images not displayed]

FINDINGS: Mammographic images were obtained following ultrasound-guided
radioactive seed placement. These demonstrate the radioactive seed
to be located within the area of distortion.
IMPRESSION: Appropriate location of the radioactive seed.

Final Assessment: Post Procedure Mammograms for Seed Placement

## 2022-10-09 IMAGING — MG MM BREAST SURGICAL SPECIMEN
1 series · 1 of 1 positions shown · non-contrast
Comparison: Previous exam(s).

CLINICAL DATA: Specimen radiograph status post left breast
excisional biopsy.

EXAM:
SPECIMEN RADIOGRAPH OF THE LEFT BREAST

[L]
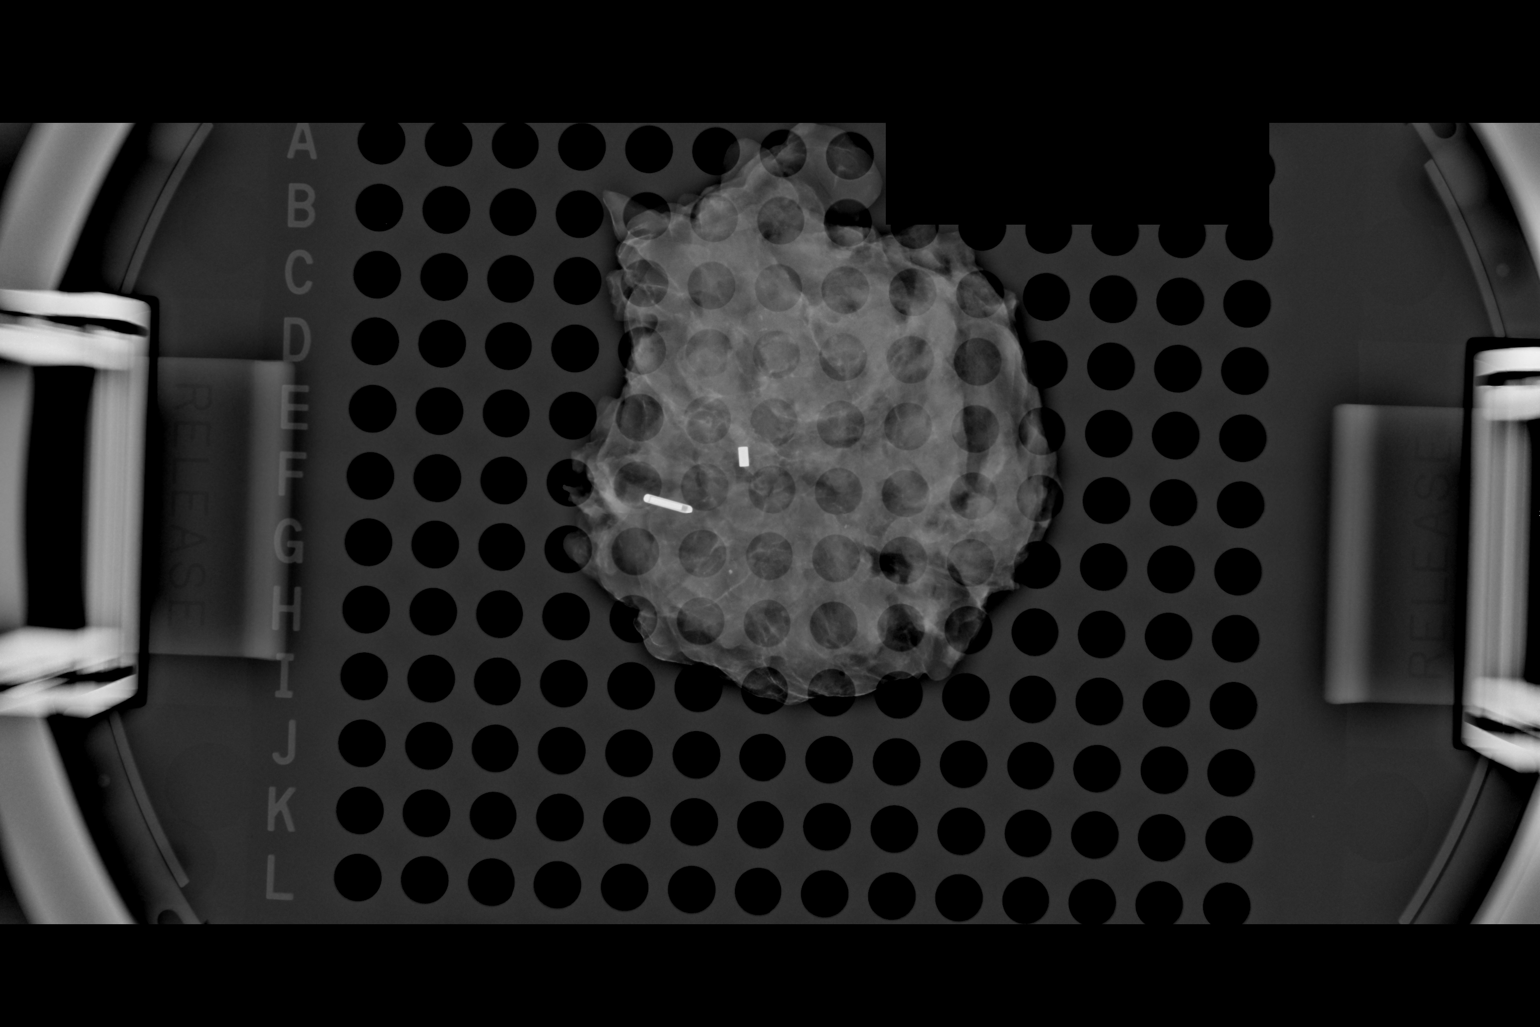

[1 of 1 positions shown; findings below may reference images not displayed]

FINDINGS: Status post excision of the left breast. The radioactive seed and
biopsy marker clip are present, completely intact, and were marked
for pathology. These findings were communicated with the OR at [DATE]
a.m.
IMPRESSION: Specimen radiograph of the left breast.

## 2023-12-03 ENCOUNTER — Encounter: Payer: Self-pay | Admitting: Urology

## 2023-12-03 ENCOUNTER — Ambulatory Visit: Payer: Self-pay | Admitting: Urology

## 2023-12-03 VITALS — BP 144/78 | HR 91 | Ht 62.0 in | Wt 128.0 lb

## 2023-12-03 DIAGNOSIS — N2889 Other specified disorders of kidney and ureter: Secondary | ICD-10-CM

## 2023-12-03 LAB — URINALYSIS, ROUTINE W REFLEX MICROSCOPIC
Bilirubin, UA: NEGATIVE
Glucose, UA: NEGATIVE
Ketones, UA: NEGATIVE
Leukocytes,UA: NEGATIVE
Nitrite, UA: NEGATIVE
Protein,UA: NEGATIVE
RBC, UA: NEGATIVE
Specific Gravity, UA: 1.01 (ref 1.005–1.030)
Urobilinogen, Ur: 0.2 mg/dL (ref 0.2–1.0)
pH, UA: 6 (ref 5.0–7.5)

## 2023-12-03 NOTE — Patient Instructions (Signed)
 A Growth in the Kidney (Renal Mass): What to Know  A renal mass is an abnormal growth in the kidney. It may be found during an MRI, CT scan, or ultrasound that's done to check for other problems in the belly. Some renal masses are cancerous, or malignant, and can grow or spread quickly. Others are benign, which means they're not cancer. Renal masses include: Tumors. These may be either cancerous or benign. The most common cancerous tumor in adults is called renal cell carcinoma. In children, the most common type is Wilms tumor. The most common kidney tumors that aren't cancer include renal adenomas, oncocytomas, and angiomyolipomas (AML). Cysts. These are pockets of fluid that form on or in the kidney. What are the causes? Certain types of cancers, infections, or injuries can cause a renal mass. It's not always known what causes a cyst to form in or on the kidney. What are the signs or symptoms? Often, a renal mass or kidney cyst doesn't cause any signs or symptoms. How is this diagnosed? Your health care provider may suggest tests to diagnose the cause of your renal mass. These tests may include: A physical exam. Blood tests. Pee (urine) tests. Imaging tests, such as: CT scan. MRI. Ultrasound. Chest X-ray or bone scan. These may be done if a tumor is cancerous to see if the cancer has spread outside the kidney. Biopsy. This is when a small piece of tissue is removed from the renal mass for testing. How is this treated? Treatment is not always needed for a renal mass. Treatment will depend on the cause of the mass and if it's causing any problems or symptoms. For a cancerous renal mass, treatment options may include: Surgery. This is done to remove the tumor and any affected tissue. Chemotherapy. This uses medicines to kill cancer cells. Radiation. High-energy X-rays or gamma rays are used to kill cancer cells. Ablation. This uses extreme hot or cold temperature to kill the cancer  cells. Immunotherapy. Medicines are used to help the body's defense system (immune system) fight the cancer cells. Taking part in clinical trials. This involves trying new or experimental treatments to see if they're effective. Most kidney cysts don't need to be treated. Follow these instructions at home: What you need to do at home will depend on the cause of the mass. Follow the instructions that your provider gives you. In general: Take medicines only as told. If you were given antibiotics, take them as told. Do not stop taking them even if you start to feel better. Follow any instructions from your provider about what you can and can't do. Do not smoke, vape, or use nicotine or tobacco. Keep all follow-up visits. Your provider will need to check if your renal mass has changed or grown. Contact a health care provider if: You have flank pain, which is pain in your side or back. You have a fever. You have a loss of appetite. You have pain or swelling in your belly. You lose weight. Get help right away if: Your pain gets worse. There's blood in your pee. You can't pee. This information is not intended to replace advice given to you by your health care provider. Make sure you discuss any questions you have with your health care provider. Document Revised: 01/05/2023 Document Reviewed: 01/05/2023 Elsevier Patient Education  2024 [employer].

## 2023-12-03 NOTE — Progress Notes (Signed)
 12/03/2023 11:06 AM   Nicole Potts 1971/02/25 [id number]  Referring provider: Skillman, Katherine E, PA-C [address] [place],  [place] [address]  Renal abnormality   HPI: Nicole Potts is a 53yo here for evaluation of a renal mass/renal abnormality.  She was noted increased cortical echogenic kidneys on 2 renal Ultrasound 3 years apart. Creatinine 0.7-0.85. UA today is normal. No family history of ESRD. She has had hypertension since age 76 which is controlled with medication.    PMH: Past Medical History:  Diagnosis Date   Dysrhythmia    During pregnancy; wore Holter monitor for month. No further work-up needed. 21 years ago.    Heart murmur    [place]   High cholesterol    Hypertension     Surgical History: Past Surgical History:  Procedure Laterality Date   RADIOACTIVE SEED GUIDED EXCISIONAL BREAST BIOPSY Left 10/14/2020   Procedure: RADIOACTIVE SEED GUIDED EXCISIONAL LEFT BREAST BIOPSY;  Surgeon: Lockie Rima, MD;  Location: [facility] OR;  Service: General;  Laterality: Left;   TUBAL LIGATION     WISDOM TOOTH EXTRACTION      Home Medications:  Allergies as of 12/03/2023       Reactions   Sulfa Antibiotics    Keflex [cephalexin] Rash   Under the skin "splotchy ness"        Medication List        Accurate as of Dec 03, 2023 11:06 AM. If you have any questions, ask your nurse or doctor.          atorvastatin 10 MG tablet Commonly known as: LIPITOR Take 10 mg by mouth at bedtime.   lisinopril 5 MG tablet Commonly known as: ZESTRIL Take 5 mg by mouth at bedtime.   oxyCODONE  5 MG immediate release tablet Commonly known as: Oxy IR/ROXICODONE  Take 1 tablet (5 mg total) by mouth every 6 (six) hours as needed for severe pain.        Allergies:  Allergies  Allergen Reactions   Sulfa Antibiotics    Keflex [Cephalexin] Rash    Under the skin "splotchy ness"    Family History: No family history on file.  Social History:  reports that she quit smoking  about 11 years ago. Her smoking use included cigarettes. She has never used smokeless tobacco. She reports current alcohol use. She reports that she does not use drugs.  ROS: All other review of systems were reviewed and are negative except what is noted above in HPI  Physical Exam: BP (!) 144/78   Pulse 91   Ht 5\' 2"  (1.575 m)   Wt 128 lb (58.1 kg)   BMI 23.41 kg/m   Constitutional:  Alert and oriented, No acute distress. HEENT: [place] AT, moist mucus membranes.  Trachea midline, no masses. Cardiovascular: No clubbing, cyanosis, or edema. Respiratory: Normal respiratory effort, no increased work of breathing. GI: Abdomen is soft, nontender, nondistended, no abdominal masses GU: No CVA tenderness.  Lymph: No cervical or inguinal lymphadenopathy. Skin: No rashes, bruises or suspicious lesions. Neurologic: Grossly intact, no focal deficits, moving all 4 extremities. Psychiatric: Normal mood and affect.  Laboratory Data: Lab Results  Component Value Date   WBC 5.3 10/11/2020   HGB 12.9 10/11/2020   HCT 37.2 10/11/2020   MCV 91.0 10/11/2020   PLT 215 10/11/2020    Lab Results  Component Value Date   CREATININE 0.85 10/11/2020    No results found for: "PSA"  No results found for: "TESTOSTERONE"  No results found  for: "HGBA1C"  Urinalysis No results found for: "COLORURINE", "APPEARANCEUR", "LABSPEC", "PHURINE", "GLUCOSEU", "HGBUR", "BILIRUBINUR", "KETONESUR", "PROTEINUR", "UROBILINOGEN", "NITRITE", "LEUKOCYTESUR"  No results found for: "LABMICR", "WBCUA", "RBCUA", "LABEPIT", "MUCUS", "BACTERIA"  Pertinent Imaging:  No results found for this or any previous visit.  No results found for this or any previous visit.  No results found for this or any previous visit.  No results found for this or any previous visit.  No results found for this or any previous visit.  No results found for this or any previous visit.  No results found for this or any previous visit.  No  results found for this or any previous visit.   Assessment & Plan:    1. Renal mass (Primary) -CT abd/pelvis w/wo contrast Followup 8-10 weeks - Urinalysis, Routine w reflex microscopic   No follow-ups on file.  Johnie Nailer, MD  [facility] [place]

## 2024-01-21 ENCOUNTER — Ambulatory Visit (HOSPITAL_COMMUNITY)
Admission: RE | Admit: 2024-01-21 | Discharge: 2024-01-21 | Disposition: A | Discharge status: Home / Self Care | Source: Ambulatory Visit | Attending: Urology | Admitting: Urology

## 2024-01-21 DIAGNOSIS — N2889 Other specified disorders of kidney and ureter: Secondary | ICD-10-CM | POA: Diagnosis present

## 2024-01-21 LAB — POCT I-STAT CREATININE: Creatinine, Ser: 0.9 mg/dL (ref 0.44–1.00)

## 2024-01-21 MED ORDER — IOHEXOL 300 MG/ML  SOLN
100.0000 mL | Freq: Once | INTRAMUSCULAR | Status: AC | PRN
Start: 2024-01-21 — End: 2024-01-21
  Administered 2024-01-21: 100 mL via INTRAVENOUS

## 2024-02-04 ENCOUNTER — Ambulatory Visit: Admitting: Urology

## 2024-02-04 VITALS — BP 114/72 | HR 91

## 2024-02-04 DIAGNOSIS — N2889 Other specified disorders of kidney and ureter: Secondary | ICD-10-CM

## 2024-02-04 LAB — URINALYSIS, ROUTINE W REFLEX MICROSCOPIC
Bilirubin, UA: NEGATIVE
Glucose, UA: NEGATIVE
Ketones, UA: NEGATIVE
Leukocytes,UA: NEGATIVE
Nitrite, UA: NEGATIVE
Protein,UA: NEGATIVE
Specific Gravity, UA: <1.005 — ABNORMAL LOW (ref 1.005–1.030)
Urobilinogen, Ur: 0.2 mg/dL (ref 0.2–1.0)
pH, UA: 7 (ref 5.0–7.5)

## 2024-02-04 LAB — MICROSCOPIC EXAMINATION: Bacteria, UA: NONE SEEN

## 2024-02-04 NOTE — Progress Notes (Unsigned)
 02/04/2024 11:57 AM   Nicole Potts 12/07/1970 [id number]  Referring provider: Skillman, Katherine E, PA-C [address] [place],  [place] [address]  No chief complaint on file.   HPI:    PMH: Past Medical History:  Diagnosis Date   Dysrhythmia    During pregnancy; wore Holter monitor for month. No further work-up needed. 21 years ago.    Heart murmur    [place]   High cholesterol    Hypertension     Surgical History: Past Surgical History:  Procedure Laterality Date   RADIOACTIVE SEED GUIDED EXCISIONAL BREAST BIOPSY Left 10/14/2020   Procedure: RADIOACTIVE SEED GUIDED EXCISIONAL LEFT BREAST BIOPSY;  Surgeon: Aron Shoulders, MD;  Location: [facility] OR;  Service: General;  Laterality: Left;   TUBAL LIGATION     WISDOM TOOTH EXTRACTION      Home Medications:  Allergies as of 02/04/2024       Reactions   Sulfa Antibiotics    Keflex [cephalexin] Rash   Under the skin splotchy ness        Medication List        Accurate as of February 04, 2024 11:57 AM. If you have any questions, ask your nurse or doctor.          atorvastatin 10 MG tablet Commonly known as: LIPITOR Take 10 mg by mouth at bedtime.   lisinopril 5 MG tablet Commonly known as: ZESTRIL Take 5 mg by mouth at bedtime.   oxyCODONE  5 MG immediate release tablet Commonly known as: Oxy IR/ROXICODONE  Take 1 tablet (5 mg total) by mouth every 6 (six) hours as needed for severe pain.        Allergies:  Allergies  Allergen Reactions   Sulfa Antibiotics    Keflex [Cephalexin] Rash    Under the skin splotchy ness    Family History: No family history on file.  Social History:  reports that she quit smoking about 11 years ago. Her smoking use included cigarettes. She has never used smokeless tobacco. She reports current alcohol use. She reports that she does not use drugs.  ROS: All other review of systems were reviewed and are negative except what is noted above in HPI  Physical Exam: BP  114/72   Pulse 91   Constitutional:  Alert and oriented, No acute distress. HEENT: [place] AT, moist mucus membranes.  Trachea midline, no masses. Cardiovascular: No clubbing, cyanosis, or edema. Respiratory: Normal respiratory effort, no increased work of breathing. GI: Abdomen is soft, nontender, nondistended, no abdominal masses GU: No CVA tenderness.  Lymph: No cervical or inguinal lymphadenopathy. Skin: No rashes, bruises or suspicious lesions. Neurologic: Grossly intact, no focal deficits, moving all 4 extremities. Psychiatric: Normal mood and affect.  Laboratory Data: Lab Results  Component Value Date   WBC 5.3 10/11/2020   HGB 12.9 10/11/2020   HCT 37.2 10/11/2020   MCV 91.0 10/11/2020   PLT 215 10/11/2020    Lab Results  Component Value Date   CREATININE 0.90 01/21/2024    No results found for: PSA  No results found for: TESTOSTERONE  No results found for: HGBA1C  Urinalysis    Component Value Date/Time   APPEARANCEUR Clear 12/03/2023 1051   GLUCOSEU Negative 12/03/2023 1051   BILIRUBINUR Negative 12/03/2023 1051   PROTEINUR Negative 12/03/2023 1051   NITRITE Negative 12/03/2023 1051   LEUKOCYTESUR Negative 12/03/2023 1051    Lab Results  Component Value Date   LABMICR Comment 12/03/2023    Pertinent Imaging: *** No results  found for this or any previous visit.  No results found for this or any previous visit.  No results found for this or any previous visit.  No results found for this or any previous visit.  No results found for this or any previous visit.  No results found for this or any previous visit.  No results found for this or any previous visit.  No results found for this or any previous visit.   Assessment & Plan:    1. Renal mass (Primary) No mass seen on CT. Followup PRN.  - Urinalysis, Routine w reflex microscopic   No follow-ups on file.  [name], MD  [facility] [place]

## 2024-02-05 ENCOUNTER — Encounter: Payer: Self-pay | Admitting: Urology
